# Patient Record
Sex: Male | Born: 1955 | Race: Black or African American | Hispanic: No | Marital: Married
Health system: Southern US, Community
[De-identification: ages and names within clinical notes are randomized; demographics above are authoritative.]

## PROBLEM LIST (undated history)

## (undated) DIAGNOSIS — E119 Type 2 diabetes mellitus without complications: Secondary | ICD-10-CM

---

## 2014-08-22 ENCOUNTER — Emergency Department (HOSPITAL_COMMUNITY): Payer: Managed Care, Other (non HMO)

## 2014-08-22 ENCOUNTER — Inpatient Hospital Stay (HOSPITAL_COMMUNITY): Payer: Managed Care, Other (non HMO)

## 2014-08-22 ENCOUNTER — Encounter (HOSPITAL_COMMUNITY): Payer: Self-pay | Admitting: *Deleted

## 2014-08-22 ENCOUNTER — Inpatient Hospital Stay (HOSPITAL_COMMUNITY)
Admission: EM | Admit: 2014-08-22 | Discharge: 2014-08-25 | DRG: 871 | Disposition: E | Payer: Managed Care, Other (non HMO) | Attending: Internal Medicine | Admitting: Internal Medicine

## 2014-08-22 DIAGNOSIS — J9600 Acute respiratory failure, unspecified whether with hypoxia or hypercapnia: Secondary | ICD-10-CM | POA: Diagnosis present

## 2014-08-22 DIAGNOSIS — R4182 Altered mental status, unspecified: Secondary | ICD-10-CM | POA: Diagnosis present

## 2014-08-22 DIAGNOSIS — R6521 Severe sepsis with septic shock: Secondary | ICD-10-CM | POA: Diagnosis present

## 2014-08-22 DIAGNOSIS — R400 Somnolence: Secondary | ICD-10-CM

## 2014-08-22 DIAGNOSIS — E1165 Type 2 diabetes mellitus with hyperglycemia: Secondary | ICD-10-CM | POA: Diagnosis present

## 2014-08-22 DIAGNOSIS — R739 Hyperglycemia, unspecified: Secondary | ICD-10-CM

## 2014-08-22 DIAGNOSIS — N179 Acute kidney failure, unspecified: Secondary | ICD-10-CM | POA: Diagnosis present

## 2014-08-22 DIAGNOSIS — B9620 Unspecified Escherichia coli [E. coli] as the cause of diseases classified elsewhere: Secondary | ICD-10-CM | POA: Diagnosis present

## 2014-08-22 DIAGNOSIS — A419 Sepsis, unspecified organism: Secondary | ICD-10-CM | POA: Diagnosis present

## 2014-08-22 DIAGNOSIS — E872 Acidosis, unspecified: Secondary | ICD-10-CM

## 2014-08-22 DIAGNOSIS — I959 Hypotension, unspecified: Secondary | ICD-10-CM

## 2014-08-22 DIAGNOSIS — A4151 Sepsis due to Escherichia coli [E. coli]: Principal | ICD-10-CM | POA: Diagnosis present

## 2014-08-22 DIAGNOSIS — R6520 Severe sepsis without septic shock: Secondary | ICD-10-CM

## 2014-08-22 DIAGNOSIS — N390 Urinary tract infection, site not specified: Secondary | ICD-10-CM | POA: Diagnosis present

## 2014-08-22 DIAGNOSIS — E1310 Other specified diabetes mellitus with ketoacidosis without coma: Secondary | ICD-10-CM | POA: Diagnosis present

## 2014-08-22 DIAGNOSIS — I469 Cardiac arrest, cause unspecified: Secondary | ICD-10-CM

## 2014-08-22 DIAGNOSIS — R0603 Acute respiratory distress: Secondary | ICD-10-CM

## 2014-08-22 DIAGNOSIS — Z7982 Long term (current) use of aspirin: Secondary | ICD-10-CM | POA: Diagnosis not present

## 2014-08-22 HISTORY — DX: Type 2 diabetes mellitus without complications: E11.9

## 2014-08-22 LAB — CBC WITH DIFFERENTIAL/PLATELET
Basophils Absolute: 0 10*3/uL (ref 0.0–0.1)
Basophils Absolute: 0 10*3/uL (ref 0.0–0.1)
Basophils Relative: 0 % (ref 0–1)
Basophils Relative: 0 % (ref 0–1)
EOS ABS: 0 10*3/uL (ref 0.0–0.7)
Eosinophils Absolute: 0 10*3/uL (ref 0.0–0.7)
Eosinophils Relative: 0 % (ref 0–5)
Eosinophils Relative: 0 % (ref 0–5)
HCT: 32.7 % — ABNORMAL LOW (ref 39.0–52.0)
HCT: 30.7 % — ABNORMAL LOW (ref 39.0–52.0)
Hemoglobin: 11.5 g/dL — ABNORMAL LOW (ref 13.0–17.0)
Hemoglobin: 10.8 g/dL — ABNORMAL LOW (ref 13.0–17.0)
LYMPHS ABS: 1.7 10*3/uL (ref 0.7–4.0)
LYMPHS PCT: 6 % — AB (ref 12–46)
Lymphocytes Relative: 10 % — ABNORMAL LOW (ref 12–46)
Lymphs Abs: 0.6 10*3/uL — ABNORMAL LOW (ref 0.7–4.0)
MCH: 29 pg (ref 26.0–34.0)
MCH: 28.3 pg (ref 26.0–34.0)
MCHC: 35.2 g/dL (ref 30.0–36.0)
MCHC: 35.2 g/dL (ref 30.0–36.0)
MCV: 82.4 fL (ref 78.0–100.0)
MCV: 80.6 fL (ref 78.0–100.0)
MONOS PCT: 5 % (ref 3–12)
MONOS PCT: 1 % — AB (ref 3–12)
Monocytes Absolute: 0.5 10*3/uL (ref 0.1–1.0)
Monocytes Absolute: 0.2 10*3/uL (ref 0.1–1.0)
NEUTROS ABS: 14.8 10*3/uL — AB (ref 1.7–7.7)
NEUTROS PCT: 89 % — AB (ref 43–77)
NEUTROS PCT: 89 % — AB (ref 43–77)
Neutro Abs: 8.5 10*3/uL — ABNORMAL HIGH (ref 1.7–7.7)
PLATELETS: 86 10*3/uL — AB (ref 150–400)
Platelets: 78 10*3/uL — ABNORMAL LOW (ref 150–400)
RBC: 3.97 MIL/uL — ABNORMAL LOW (ref 4.22–5.81)
RBC: 3.81 MIL/uL — AB (ref 4.22–5.81)
RDW: 12.9 % (ref 11.5–15.5)
RDW: 13 % (ref 11.5–15.5)
WBC Morphology: INCREASED
WBC: 9.6 10*3/uL (ref 4.0–10.5)
WBC: 16.7 10*3/uL — ABNORMAL HIGH (ref 4.0–10.5)

## 2014-08-22 LAB — URINALYSIS, ROUTINE W REFLEX MICROSCOPIC
Glucose, UA: 100 mg/dL — AB
Ketones, ur: 15 mg/dL — AB
Nitrite: NEGATIVE
PH: 5 (ref 5.0–8.0)
Protein, ur: 100 mg/dL — AB
Specific Gravity, Urine: 1.016 (ref 1.005–1.030)
Urobilinogen, UA: 1 mg/dL (ref 0.0–1.0)

## 2014-08-22 LAB — I-STAT VENOUS BLOOD GAS, ED
ACID-BASE DEFICIT: 13 mmol/L — AB (ref 0.0–2.0)
Bicarbonate: 13.4 mEq/L — ABNORMAL LOW (ref 20.0–24.0)
O2 SAT: 42 %
PCO2 VEN: 31.3 mmHg — AB (ref 45.0–50.0)
PO2 VEN: 27 mmHg — AB (ref 30.0–45.0)
TCO2: 14 mmol/L (ref 0–100)
pH, Ven: 7.239 — ABNORMAL LOW (ref 7.250–7.300)

## 2014-08-22 LAB — COMPREHENSIVE METABOLIC PANEL
ALBUMIN: 2.1 g/dL — AB (ref 3.5–5.0)
ALK PHOS: 107 U/L (ref 38–126)
ALT: 35 U/L (ref 17–63)
ALT: 86 U/L — ABNORMAL HIGH (ref 17–63)
ANION GAP: 18 — AB (ref 5–15)
AST: 81 U/L — AB (ref 15–41)
AST: 178 U/L — ABNORMAL HIGH (ref 15–41)
Albumin: 2.3 g/dL — ABNORMAL LOW (ref 3.5–5.0)
Alkaline Phosphatase: 92 U/L (ref 38–126)
Anion gap: 18 — ABNORMAL HIGH (ref 5–15)
BILIRUBIN TOTAL: 2.8 mg/dL — AB (ref 0.3–1.2)
BUN: 44 mg/dL — ABNORMAL HIGH (ref 6–20)
BUN: 47 mg/dL — ABNORMAL HIGH (ref 6–20)
CALCIUM: 6.6 mg/dL — AB (ref 8.9–10.3)
CO2: 14 mmol/L — ABNORMAL LOW (ref 22–32)
CO2: 14 mmol/L — ABNORMAL LOW (ref 22–32)
Calcium: 7.6 mg/dL — ABNORMAL LOW (ref 8.9–10.3)
Chloride: 98 mmol/L — ABNORMAL LOW (ref 101–111)
Chloride: 102 mmol/L (ref 101–111)
Creatinine, Ser: 4.39 mg/dL — ABNORMAL HIGH (ref 0.61–1.24)
Creatinine, Ser: 4.38 mg/dL — ABNORMAL HIGH (ref 0.61–1.24)
GFR calc Af Amer: 16 mL/min — ABNORMAL LOW (ref >60)
GFR calc non Af Amer: 13 mL/min — ABNORMAL LOW (ref >60)
GFR, EST AFRICAN AMERICAN: 16 mL/min — AB (ref >60)
GFR, EST NON AFRICAN AMERICAN: 13 mL/min — AB (ref >60)
GLUCOSE: 448 mg/dL — AB (ref 65–99)
GLUCOSE: 304 mg/dL — AB (ref 65–99)
POTASSIUM: 4.1 mmol/L (ref 3.5–5.1)
Potassium: 3.6 mmol/L (ref 3.5–5.1)
SODIUM: 130 mmol/L — AB (ref 135–145)
SODIUM: 134 mmol/L — AB (ref 135–145)
TOTAL PROTEIN: 4.7 g/dL — AB (ref 6.5–8.1)
Total Bilirubin: 2.4 mg/dL — ABNORMAL HIGH (ref 0.3–1.2)
Total Protein: 4.5 g/dL — ABNORMAL LOW (ref 6.5–8.1)

## 2014-08-22 LAB — CBG MONITORING, ED
Glucose-Capillary: 369 mg/dL — ABNORMAL HIGH (ref 65–99)
Glucose-Capillary: 313 mg/dL — ABNORMAL HIGH (ref 65–99)

## 2014-08-22 LAB — I-STAT CHEM 8, ED
BUN: 42 mg/dL — ABNORMAL HIGH (ref 6–20)
BUN: 43 mg/dL — AB (ref 6–20)
CALCIUM ION: 0.97 mmol/L — AB (ref 1.12–1.23)
CREATININE: 4 mg/dL — AB (ref 0.61–1.24)
Calcium, Ion: 0.83 mmol/L — ABNORMAL LOW (ref 1.12–1.23)
Chloride: 98 mmol/L — ABNORMAL LOW (ref 101–111)
Chloride: 100 mmol/L — ABNORMAL LOW (ref 101–111)
Creatinine, Ser: 4.3 mg/dL — ABNORMAL HIGH (ref 0.61–1.24)
Glucose, Bld: 479 mg/dL — ABNORMAL HIGH (ref 65–99)
Glucose, Bld: 288 mg/dL — ABNORMAL HIGH (ref 65–99)
HEMATOCRIT: 37 % — AB (ref 39.0–52.0)
HEMATOCRIT: 32 % — AB (ref 39.0–52.0)
Hemoglobin: 12.6 g/dL — ABNORMAL LOW (ref 13.0–17.0)
Hemoglobin: 10.9 g/dL — ABNORMAL LOW (ref 13.0–17.0)
POTASSIUM: 3.5 mmol/L (ref 3.5–5.1)
Potassium: 4.2 mmol/L (ref 3.5–5.1)
Sodium: 127 mmol/L — ABNORMAL LOW (ref 135–145)
Sodium: 135 mmol/L (ref 135–145)
TCO2: 12 mmol/L (ref 0–100)
TCO2: 14 mmol/L (ref 0–100)

## 2014-08-22 LAB — POCT I-STAT 3, ART BLOOD GAS (G3+)
ACID-BASE DEFICIT: 17 mmol/L — AB (ref 0.0–2.0)
Bicarbonate: 13.4 mEq/L — ABNORMAL LOW (ref 20.0–24.0)
O2 SAT: 96 %
PCO2 ART: 51.6 mmHg — AB (ref 35.0–45.0)
TCO2: 15 mmol/L (ref 0–100)
pH, Arterial: 7.023 — CL (ref 7.350–7.450)
pO2, Arterial: 122 mmHg — ABNORMAL HIGH (ref 80.0–100.0)

## 2014-08-22 LAB — URINE MICROSCOPIC-ADD ON

## 2014-08-22 LAB — I-STAT TROPONIN, ED
TROPONIN I, POC: 0.64 ng/mL — AB (ref 0.00–0.08)
Troponin i, poc: 1.67 ng/mL (ref 0.00–0.08)

## 2014-08-22 LAB — I-STAT CG4 LACTIC ACID, ED
LACTIC ACID, VENOUS: 11.85 mmol/L — AB (ref 0.5–2.0)
Lactic Acid, Venous: 10.34 mmol/L (ref 0.5–2.0)

## 2014-08-22 LAB — MAGNESIUM: Magnesium: 1.4 mg/dL — ABNORMAL LOW (ref 1.7–2.4)

## 2014-08-22 LAB — PROTIME-INR
INR: 2.07 — AB (ref 0.00–1.49)
Prothrombin Time: 23.2 seconds — ABNORMAL HIGH (ref 11.6–15.2)

## 2014-08-22 LAB — GRAM STAIN

## 2014-08-22 LAB — PHOSPHORUS: PHOSPHORUS: 7 mg/dL — AB (ref 2.5–4.6)

## 2014-08-22 LAB — APTT: APTT: 72 s — AB (ref 24–37)

## 2014-08-22 LAB — CORTISOL: CORTISOL PLASMA: 38.3 ug/dL

## 2014-08-22 LAB — GLUCOSE, CAPILLARY: Glucose-Capillary: 247 mg/dL — ABNORMAL HIGH (ref 65–99)

## 2014-08-22 LAB — LACTIC ACID, PLASMA: Lactic Acid, Venous: 9.6 mmol/L (ref 0.5–2.0)

## 2014-08-22 MED ORDER — HEPARIN SODIUM (PORCINE) 5000 UNIT/ML IJ SOLN: 5000.0000 [IU] | Freq: Three times a day (TID) | INTRAMUSCULAR | Status: DC

## 2014-08-22 MED ORDER — LIDOCAINE HCL (CARDIAC) 20 MG/ML IV SOLN
INTRAVENOUS | Status: AC
  Filled 2014-08-22: qty 5

## 2014-08-22 MED ORDER — PIPERACILLIN-TAZOBACTAM 3.375 G IVPB 30 MIN
3.3750 g | Freq: Three times a day (TID) | INTRAVENOUS | Status: DC
  Administered 2014-08-22: 3.375 g via INTRAVENOUS
  Filled 2014-08-22 (×4): qty 50

## 2014-08-22 MED ORDER — SODIUM CHLORIDE 0.9 % IV BOLUS (SEPSIS)
30.0000 mL/kg | Freq: Once | INTRAVENOUS | Status: AC
  Administered 2014-08-22: 2721 mL via INTRAVENOUS

## 2014-08-22 MED ORDER — VANCOMYCIN HCL IN DEXTROSE 1-5 GM/200ML-% IV SOLN: 1000.0000 mg | INTRAVENOUS | Status: DC

## 2014-08-22 MED ORDER — ASPIRIN 81 MG PO CHEW: 324.0000 mg | CHEWABLE_TABLET | ORAL | Status: DC

## 2014-08-22 MED ORDER — SODIUM CHLORIDE 0.9 % IV SOLN: 250.0000 mL | INTRAVENOUS | Status: DC | PRN

## 2014-08-22 MED ORDER — SODIUM CHLORIDE 0.9 % IV SOLN
INTRAVENOUS | Status: DC
  Administered 2014-08-22: 5.1 [IU]/h via INTRAVENOUS
  Administered 2014-08-22: 3.1 [IU]/h via INTRAVENOUS
  Filled 2014-08-22: qty 2.5

## 2014-08-22 MED ORDER — EPINEPHRINE HCL 1 MG/ML IJ SOLN
0.5000 ug/min | INTRAVENOUS | Status: DC
  Filled 2014-08-22 (×2): qty 4

## 2014-08-22 MED ORDER — VANCOMYCIN HCL IN DEXTROSE 1-5 GM/200ML-% IV SOLN
1000.0000 mg | Freq: Once | INTRAVENOUS | Status: AC
  Administered 2014-08-22: 1000 mg via INTRAVENOUS
  Filled 2014-08-22: qty 200

## 2014-08-22 MED ORDER — VASOPRESSIN 20 UNIT/ML IV SOLN
0.0300 [IU]/min | INTRAVENOUS | Status: DC
  Filled 2014-08-22: qty 2

## 2014-08-22 MED ORDER — PHENYLEPHRINE HCL 10 MG/ML IJ SOLN
20.0000 ug/min | INTRAVENOUS | Status: DC
  Filled 2014-08-22 (×2): qty 1

## 2014-08-22 MED ORDER — PANTOPRAZOLE SODIUM 40 MG IV SOLR: 40.0000 mg | Freq: Every day | INTRAVENOUS | Status: DC

## 2014-08-22 MED ORDER — SUCCINYLCHOLINE CHLORIDE 20 MG/ML IJ SOLN
INTRAMUSCULAR | Status: AC
  Filled 2014-08-22: qty 1

## 2014-08-22 MED ORDER — SODIUM CHLORIDE 0.9 % IV BOLUS (SEPSIS)
1000.0000 mL | Freq: Once | INTRAVENOUS | Status: AC
  Administered 2014-08-22: 1000 mL via INTRAVENOUS

## 2014-08-22 MED ORDER — SODIUM CHLORIDE 0.9 % IV SOLN: INTRAVENOUS | Status: DC

## 2014-08-22 MED ORDER — ASPIRIN 300 MG RE SUPP: 300.0000 mg | RECTAL | Status: DC

## 2014-08-22 MED ORDER — SODIUM CHLORIDE 0.9 % IV SOLN: INTRAVENOUS | Status: AC

## 2014-08-22 MED ORDER — NOREPINEPHRINE BITARTRATE 1 MG/ML IV SOLN
0.0000 ug/min | INTRAVENOUS | Status: DC
  Filled 2014-08-22: qty 4

## 2014-08-22 MED ORDER — DEXTROSE-NACL 5-0.45 % IV SOLN: INTRAVENOUS | Status: DC

## 2014-08-22 MED ORDER — POTASSIUM CHLORIDE 10 MEQ/50ML IV SOLN
INTRAVENOUS | Status: AC
  Filled 2014-08-22: qty 150

## 2014-08-22 MED ORDER — ETOMIDATE 2 MG/ML IV SOLN
INTRAVENOUS | Status: AC
  Filled 2014-08-22: qty 20

## 2014-08-22 MED ORDER — ROCURONIUM BROMIDE 50 MG/5ML IV SOLN
INTRAVENOUS | Status: AC
  Filled 2014-08-22: qty 2

## 2014-08-22 MED ORDER — INSULIN REGULAR HUMAN 100 UNIT/ML IJ SOLN
INTRAMUSCULAR | Status: DC
  Filled 2014-08-22: qty 2.5

## 2014-08-22 NOTE — Code Documentation (Signed)
Sod bicarb bristojet given

## 2014-08-22 NOTE — Code Documentation (Signed)
EPI DRIP INCREASED TO  IV MORGAN RN

## 2014-08-22 NOTE — Code Documentation (Signed)
No pulse 

## 2014-08-22 NOTE — Code Documentation (Signed)
  roc iv

## 2014-08-22 NOTE — Progress Notes (Signed)
Patient transported to 2M03 without any complications

## 2014-08-22 NOTE — Code Documentation (Signed)
Atropine 1 jet iv brenda.rn

## 2014-08-22 NOTE — Code Documentation (Signed)
Pt stopped breathing.  cpr initiated.  Heart rhy no pulse.

## 2014-08-22 NOTE — Code Documentation (Signed)
epine 1 jet brenda rn

## 2014-08-22 NOTE — Progress Notes (Signed)
Chaplain responded to a code blue. Pt was getting CPR. Chaplain  Called family in [place]. Pt transitioned while family was in route. Chaplain came to support family when they arrived.    09-16-2014 2200  Clinical Encounter Type  Visited With Family  Visit Type Spiritual support  Referral From Care management  Spiritual Encounters  Spiritual Needs Prayer;Emotional;Grief support  Stress Factors  Family Stress Factors Loss

## 2014-08-22 NOTE — ED Notes (Signed)
Stoney Karczewski, wife [phone], Dahlia Client [phone]

## 2014-08-22 NOTE — Code Documentation (Signed)
PULSE GETTING THREADY

## 2014-08-22 NOTE — Code Documentation (Signed)
cpr continues. Being bagged

## 2014-08-22 NOTE — Code Documentation (Signed)
CPR RESUMED NO PULSE.

## 2014-08-22 NOTE — H&P (Addendum)
PULMONARY / CRITICAL CARE MEDICINE   Name: Anthony Ferrell MRN: [id number] DOB: [birth year]    ADMISSION DATE:  08/18/2014 CONSULTATION DATE:  08/14/2014  REFERRING MD :  edp  CHIEF COMPLAINT: lactic acidosis, change in ms  INITIAL PRESENTATION:  59 yr old unclear pmh except DM found unresponsive, hyperglycemia, urosepsis (uti, septic shock), ARF  STUDIES:  7/29 ct head>>>left temp swelling, unclear foreign body, neg brain ct  SIGNIFICANT EVENTS: 7/29 - admitted confused, hyperglycemic, LA elevated   HISTORY OF PRESENT ILLNESS:  59 yr old no history available, Pt picked up by [facility] from hotel parking lot. pt was walking around parking lot in soiled underwear, stumbling and not making any sense. EMS stated cbg in 500's. Given 1 L en-roue. tachycardic. FOund with ARF presumed crt 4, hyponatremic, LA 11.85 and acidotic. Presumed dka component.  PAST MEDICAL HISTORY :   has a past medical history of Diabetes mellitus without complication.  has no past surgical history on file. Prior to Admission medications   Medication Sig Start Date End Date Taking? Authorizing Provider  aspirin 81 MG tablet Take 81 mg by mouth daily.   Yes Historical Provider, MD  Liraglutide (VICTOZA) 18 MG/3ML SOPN Inject into the skin. Patient is unable to tell me the dose he is taking   Yes Historical Provider, MD  nitroGLYCERIN (NITROSTAT) 0.4 MG SL tablet Place 0.4 mg under the tongue every 5 (five) minutes as needed for chest pain.   Yes Historical Provider, MD  pioglitazone-glimepiride (DUETACT) 30-2 MG per tablet Take 1 tablet by mouth daily with breakfast.   Yes Historical Provider, MD  pravastatin (PRAVACHOL) 20 MG tablet Take 20 mg by mouth daily.   Yes Historical Provider, MD  ramipril (ALTACE) 5 MG capsule Take 5 mg by mouth daily.   Yes Historical Provider, MD  rosuvastatin (CRESTOR) 20 MG tablet Take 20 mg by mouth daily.   Yes Historical Provider, MD   No Known Allergies  FAMILY HISTORY:  has no  family status information on file.  unknown, unable SOCIAL HISTORY: unable, unknown, not able to give history    REVIEW OF SYSTEMS: change in MS, unknwon , unable  SUBJECTIVE: sleeping, mild increase RR  VITAL SIGNS: Temp:  [102.1 F (38.9 C)] 102.1 F (38.9 C) (07/29 1550) Pulse Rate:  [73] 73 (07/29 1545) Resp:  [28-37] 37 (07/29 1600) BP: (67-90)/(42-57) 72/53 mmHg (07/29 1615) SpO2:  [37 %] 37 % (07/29 1545) Weight:  [77.111 kg (170 lb)] 77.111 kg (170 lb) (07/29 1656) HEMODYNAMICS:   VENTILATOR SETTINGS:   INTAKE / OUTPUT: No intake or output data in the 24 hours ending 08/11/2014 1815  PHYSICAL EXAMINATION: General:  Awakens,. confused Neuro:  Moves all ext equally 2 mm perrl HEENT:  Defect in left neck, prior surgery?, nodes or vessels palpable Cardiovascular:  s1 s2 RRR Lungs:  CTA Abdomen:  Soft, BS wnl. No r/g Musculoskeletal: no edema Skin:  Diffuse vesicular rash, not red  LABS:  CBC  Recent Labs Lab 07/25/2014 1535 08/04/2014 1549  WBC 9.6  --   HGB 11.5* 12.6*  HCT 32.7* 37.0*  PLT 86*  --    Coag's No results for input(s): APTT, INR in the last 168 hours. BMET  Recent Labs Lab 08/03/2014 1535 07/25/2014 1549  NA 130* 127*  K 4.1 4.2  CL 98* 98*  CO2 14*  --   BUN 44* 42*  CREATININE 4.39* 4.00*  GLUCOSE 448* 479*   Electrolytes  Recent Labs Lab 07/28/2014 1535  CALCIUM 7.6*   Sepsis Markers  Recent Labs Lab 08/20/2014 1551  LATICACIDVEN 11.85*   ABG No results for input(s): PHART, PCO2ART, PO2ART in the last 168 hours. Liver Enzymes  Recent Labs Lab 08/21/2014 1535  AST 81*  ALT 35  ALKPHOS 92  BILITOT 2.4*  ALBUMIN 2.3*   Cardiac Enzymes No results for input(s): TROPONINI, PROBNP in the last 168 hours. Glucose  Recent Labs Lab 08/02/2014 1708  GLUCAP 369*    Imaging Ct Head Wo Contrast  08/04/2014   CLINICAL DATA:  Altered mental status.  Disoriented.  EXAM: CT HEAD WITHOUT CONTRAST  TECHNIQUE: Contiguous axial images  were obtained from the base of the skull through the vertex without intravenous contrast.  COMPARISON:  None.  FINDINGS: No acute intracranial abnormality. Specifically, no hemorrhage, hydrocephalus, mass lesion, acute infarction, or significant intracranial injury. No acute calvarial abnormality.  There is soft tissue swelling noted in the left temporal region and extending in the left base superficial soft tissues. There is metallic foreign body noted within the soft tissues in the left lateral face inferior to the left external auditory canal. The external auditory canal is not visualized, presumably occluded by the soft tissue swelling which extends into the left ear. Recommend clinical correlation.  IMPRESSION: Soft tissue swelling in the left side of the face and temporal region and in the region of the left ear. Metallic foreign body noted within the soft tissues in the area swelling. This is of unknown etiology. Recommend clinical correlation.  No intracranial abnormality.   Electronically Signed   By: Rolm Baptise M.D.   On: 07/25/2014 17:55   Dg Chest Port 1 View  08/18/2014   CLINICAL DATA:  Altered mental status.  Fever.  EXAM: PORTABLE CHEST - 1 VIEW  COMPARISON:  None.  FINDINGS: There is slight haziness at both lung bases but the patient has taken a shallow inspiration. Heart size and pulmonary vascularity are normal. No acute osseous abnormality.  IMPRESSION: Haziness at the bases is most likely due to a shallow inspiration but I cannot exclude subtle infiltrates.   Electronically Signed   By: Lorriane Shire M.D.   On: 08/09/2014 16:21     ASSESSMENT / PLAN:  PULMONARY  A: Likely slight uncompensated met acidosis (dka), acute respiratory failure P:   No evidence pNA Treat dka  CARDIOVASCULAR A: Presumed occult Septic shock, hypovolemia P:  LA repeat needed after 30 cc/kg bolus Hope will clear rapid with volume as appears so dry May need pressors, he is improving BP and perfusion  now Tele May need cvp line and cvp May need echo assessment Assess cortisol  RENAL A:  ARF presumed, r./o CRI, hypovolemia P:   volume 30 cc/kg received Bolus further bmet q8h  GASTROINTESTINAL A:  LFT wnl P:   Repeat LFT in am  Npo ppi  HEMATOLOGIC A:  DVT prevention P:  Sub q planned coags needed  INFECTIOUS A:  Occult septic shock, source urine likely Sepsis from e coli  P:   BCx2  7/29>>> UC 7/29>>> ZOX:WRUEA 7/29>>> vanc 7/29>>>  Needs renal US with ARF, sepsis, dirty urine r/o hydro  ENDOCRINE A:  DKA  P:   Insulin drip bmet protocol  NEUROLOGIC A:  Acute encephalopathy    Metabolic encephalopathy P:   RASS goal: 0 Ct neg Limit sedation  FAMILY  - Updates:   - Inter-disciplinary family meet or Palliative Care meeting due by: 8/5  Ccm time 40 min   Quillian Quince  J. Titus Mould, MD, [place] Pgr: [facility]  Pulmonary and [place] Pager: [phone]  08/17/2014, 6:15 PM

## 2014-08-22 NOTE — ED Notes (Signed)
Patient transported to CT 

## 2014-08-22 NOTE — Code Documentation (Signed)
THE PT HAS A PULSE.

## 2014-08-22 NOTE — Progress Notes (Addendum)
Pt time of death is 2105. No heart or breath sound present. By Caralee Ates, RN, and Ladora Daniel, RN. Asystoli on monitor. Pupils fixed and dilated. Family not present at time of death. Called and updated by Dr. Donnamae Jude. Family arrived at 2230, Candelaria Stagers present.

## 2014-08-22 NOTE — Code Documentation (Signed)
CPR CONTINUES.

## 2014-08-22 NOTE — Code Documentation (Signed)
PULSE BACK NO PERFUSION  BY [place]

## 2014-08-22 NOTE — Progress Notes (Signed)
Patient WILL be a medical examiner case per on call ME.

## 2014-08-22 NOTE — Code Documentation (Signed)
CODE BLUE NOTE  Patient Name: Anthony Ferrell   MRN: [id number]   Date of Birth/ Sex: 04/28/1955 , male      Admission Date: 09/10/14  Attending Provider: Nelda Bucks, MD  Primary Diagnosis: Altered mental status [R41.82] Sepsis [A41.9]    Indication: Pt brought by [facility] from hotel parking lot where he was found to be in soiled underwear, stumbling and not making any sense. EMS stated cbg in 500's. Given 1 L en-roue. tachycardic. Found with ARF presumed crt 4, hyponatremic, LA 11.85 and acidotic.  Went into [facility]. Code blue was subsequently called. At the time of arrival on scene, ACLS protocol was underway.   Technical Description:  - CPR performance duration:  35  minutes   - Was defibrillation or cardioversion used? Yes   - Was external pacer placed? No  - Was patient intubated pre/post CPR? Yes    Medications Administered: Y = Yes; Blank = No Amiodarone    Atropine    Calcium  yes  Epinephrine  yes  Lidocaine    Magnesium  yes  Norepinephrine    Phenylephrine    Sodium bicarbonate  yes  Vasopressin  yes    Post CPR evaluation:  - Final Status - Was patient successfully resuscitated ? No   Miscellaneous Information:  - Time of death:  21:05  PM  - Primary team notified?  Yes  - Family Notified? No. Family from [place]. [place]. Family contacted when pt was in code. They were on there way here. It was decided not to call them while they are on road out of concern for their safety.       Almon Hercules, MD   10-Sep-2014, 9:59 PM

## 2014-08-22 NOTE — Code Documentation (Signed)
intubaTED 25 AT THE TEETH.PER DR Hyacinth Meeker

## 2014-08-22 NOTE — Code Documentation (Signed)
CHEST COMPRESSIONS STOPPED

## 2014-08-22 NOTE — Code Documentation (Signed)
PULSE PRESENT 

## 2014-08-22 NOTE — Code Documentation (Signed)
preparinbg to intubate

## 2014-08-22 NOTE — Code Documentation (Signed)
RT GROIN CENTRAL LINE PLACED ED RES

## 2014-08-22 NOTE — Code Documentation (Signed)
Still attempting to intubate.  cpr continues

## 2014-08-22 NOTE — Code Documentation (Signed)
BLOOD DRAWN.

## 2014-08-22 NOTE — Code Documentation (Signed)
EPI DRIP  STARTED MORGAN IV LT A-C  0.5

## 2014-08-22 NOTE — Code Documentation (Signed)
NO PULSE AGAIN CPR STARTED

## 2014-08-22 NOTE — Code Documentation (Signed)
CPR CONTINUED  ?

## 2014-08-22 NOTE — ED Notes (Signed)
PORT CHEST FOR TUBE PLACEMENT

## 2014-08-22 NOTE — Code Documentation (Signed)
EPIN JET IV MORGAN RN 

## 2014-08-22 NOTE — Code Documentation (Signed)
cpr continues

## 2014-08-22 NOTE — Code Documentation (Signed)
CHEST COMPRESSION STOPPED

## 2014-08-22 NOTE — Code Documentation (Signed)
CODE RESTARTED CPR

## 2014-08-22 NOTE — Code Documentation (Signed)
CHEST COMPRESSIONS STOPPED 

## 2014-08-22 NOTE — Code Documentation (Signed)
EPINE JET IV BRENDA RN

## 2014-08-22 NOTE — ED Notes (Signed)
Pt picked up by [facility] from hotel parking lot.  Staff stated pt was walking around parking lot in soiled underwear, stumbling and not making any sense.  EMS stated cbg in 500's.  Given 1 L en-roue.  HR in 140's.  Pt responsive to voice.  Able to give name of wife.

## 2014-08-22 NOTE — Code Documentation (Signed)
EDP AND CRITICAL CARE AT THE BEDSIDE

## 2014-08-22 NOTE — Code Documentation (Signed)
ATROPINE JET IV MORGAN RN

## 2014-08-22 NOTE — Code Documentation (Signed)
EPIN JET   IV BRENDA RN

## 2014-08-22 NOTE — ED Provider Notes (Addendum)
The pt is a 59 y/o male brought in by EMS with AMS, patient is unable to answer any questions as he has altered mental status. According to the first responders the patient was found walking around the parking lot, confused, stumbling, he was brought back to his room, paramedics were called, found him to be hypotensive and hyperglycemic. A patient's belongings were in the room, he does have an insulin pen as well as blood glucose testing strips. The patient is able to say his name, he is able to answer occasional questions with one-word answers, he is very tachycardic, severely hypotensive, has no peripheral edema, soft abdomen without guarding, he is to keep neck. His mucous membranes are severely dry. His mental status is very abnormal, the patient will need further workup with regards to metabolic infectious or traumatic etiologies. Of note the patient does have abnormal shape to the left side of his head and face suggesting prior surgery. We'll obtain CT scan of the brain, labs, potential DKA or other acidosis, check renal function, fluid bolus for fluid resuscitation. I personally placed an IV at the bedside due to inadequate IV access. The patient will need a central line if he does not respond to fluid boluses. The patient is critically ill.  Critical interventions   #1 multiple fluid boluses  #2 and type paramedics #3 broad-spectrum antibiotics to cover sepsis and septic shock #4 diabetic ketoacidosis insulin drip   the patient is critically ill, we'll discuss his care with the intensivist, needs admission to high level of care.  ED Sepsis - Repeat Assessment   Performed at:    August 22, 2014, 6:47 PM   Last Vitals:    Blood pressure 72/53, pulse 73, temperature 102.1 F (38.9 C), temperature source Rectal, resp. rate 37, height 5\' 9"  (1.753 m), weight 170 lb (77.111 kg), SpO2 37 %.  Heart:      Persistent tachycardia, no murmurs  Lungs:     Decreased lung sounds, mild  tachypnea  Capillary Refill:   Decreased  Peripheral Pulse (include location): Week at the radial arteries, strong femoral pulses   Skin (include color):   Pale, dry  The patient had continued decline, mean arterial pressure stayed between 55 and 60 however as the patient was being prepared to be transferred to the ICU he lost pulses, he went unresponsive and went into pulseless electrical activity. The patient was resuscitated with CPR, multiple medications were given including epinephrine, atropine, bicarbonate, he responded well to epinephrine however the patient required intubation and central line placement for pressors, epinephrine drip was started. Critical care aware and has accepted the patient to the ICU. The patient stabilized on an epinephrine drip and was able to be transferred to the intensive care unit under the care of Dr. Esaw Grandchild Performed by: Eber Hong D  Required items: required blood products, implants, devices, and special equipment available Patient identity confirmed: provided demographic data and hospital-assigned identification number Time out: Immediately prior to procedure a "time out" was called to verify the correct patient, procedure, equipment, support staff and site/side marked as required.  Initial intubation attempt by resident was unsuccessful, difficulty visualizing cords, I successfully attempted intubation 1 attempt.   Indications: cardiac arrest  Intubation method: Direct Laryngoscopy   Preoxygenation: BVM  Sedatives:  none Paralytic: 50mg  Rocuronium   Tube Size: 7.5 cuffed  Post-procedure assessment: chest rise and ETCO2 monitor Breath sounds: equal and absent over the epigastrium Tube secured with: ETT holder Chest x-ray interpreted by  radiologist and me.  Chest x-ray findings: endotracheal tube in appropriate position  Patient tolerated the procedure well with no immediate complications.  Cardiopulmonary Resuscitation  (CPR) Procedure Note Directed/Performed by: Vida Roller I personally directed ancillary staff and/or performed CPR in an effort to regain return of spontaneous circulation and to maintain cardiac, neuro and systemic perfusion.    CRITICAL CARE Performed by: Vida Roller Total critical care time: 60 Critical care time was exclusive of separately billable procedures and treating other patients. Critical care was necessary to treat or prevent imminent or life-threatening deterioration. Critical care was time spent personally by me on the following activities: development of treatment plan with patient and/or surrogate as well as nursing, discussions with consultants, evaluation of patient's response to treatment, examination of patient, obtaining history from patient or surrogate, ordering and performing treatments and interventions, ordering and review of laboratory studies, ordering and review of radiographic studies, pulse oximetry and re-evaluation of patient's condition.    EKG Interpretation  Date/Time:  Friday August 23, 2014 19:39:45 EDT Ventricular Rate:  164 PR Interval:  130 QRS Duration: 88 QT Interval:  290 QTC Calculation: 479 R Axis:   46 Text Interpretation:  Supraventricular tachycardia Borderline low voltage, extremity leads Abnormal R-wave progression, early transition Repolarization abnormality, prob rate related Baseline wander in lead(s) V3 V4 V5 V6 ED PHYSICIAN INTERPRETATION AVAILABLE IN [facility] Confirmed by TEST, Record ([id number]) on 08/12/2014 2:10:24 PM        Final diagnoses:  Altered mental status  Severe sepsis  Lactic acid acidosis  Hypotension, unspecified hypotension type  Hyperglycemia  Cardiac arrest  Respiratory distress   cardiac arrest Septic shock Acute renal failure Diabetic ketoacidosis   Eber Hong, MD 08/09/2014 1502  Eber Hong, MD 08/07/2014 2025

## 2014-08-22 NOTE — Code Documentation (Signed)
PULSE PRESENT

## 2014-08-22 NOTE — Progress Notes (Addendum)
ANTIBIOTIC CONSULT NOTE - INITIAL  Pharmacy Consult for Vancomycin and Zosyn Indication: rule out sepsis and UTI  Not on File  Patient Measurements: Height:  (175.3 cm) Weight: 170 lb (77.111 kg) IBW/kg (Calculated) : 70.7 Adjusted Body Weight:   Vital Signs: Temp: 102.1 F (38.9 C) (07/29 1550) Temp Source: Rectal (07/29 1550) BP: 90/47 mmHg (07/29 1529) Intake/Output from previous day:   Intake/Output from this shift:    Labs:  Recent Labs  Aug 30, 2014 1549  HGB 12.6*  CREATININE 4.00*   CrCl cannot be calculated (Unknown ideal weight.). No results for input(s): VANCOTROUGH, VANCOPEAK, VANCORANDOM, GENTTROUGH, GENTPEAK, GENTRANDOM, TOBRATROUGH, TOBRAPEAK, TOBRARND, AMIKACINPEAK, AMIKACINTROU, AMIKACIN in the last 72 hours.   Microbiology: No results found for this or any previous visit (from the past 720 hour(s)).  Medical History: Past Medical History  Diagnosis Date  . Diabetes mellitus without complication     Medications:   (Not in a hospital admission) Scheduled:   Infusions:  . piperacillin-tazobactam     Assessment: 59yo male presents with AMS and fever. Pharmacy is consulted to dose vancomycin and zosyn for UTI and suspected sepsis. Pt is febrile to 102.1, sCr 4, WBC 9.6, LA 11.85  Goal of Therapy:  Vancomycin trough level 15-20 mcg/ml  Plan:  Vancomycin 1g IV q48h Zosyn 3.375g IV q8h Measure antibiotic drug levels at steady state Follow up culture results, renal function, and clinical course  Arlean Hopping. Newman Pies, PharmD Clinical Pharmacist Pager [phone] 2014/08/30,4:05 PM

## 2014-08-22 NOTE — Code Documentation (Signed)
EPI  DRIP SWITCHED TO THE RT FEMORAL LINE

## 2014-08-22 NOTE — Code Documentation (Signed)
EPIN JET IV Multimedia programmer

## 2014-08-22 NOTE — ED Notes (Signed)
Dr Hyacinth Meeker given a copy of troponin 1.67 and lactic acid 10.34 results

## 2014-08-22 NOTE — ED Provider Notes (Signed)
CSN: [id number]     Arrival date & time 08/03/2014  1514 History   First MD Initiated Contact with Patient 07/27/2014 1519     Chief Complaint  Patient presents with  . Altered Mental Status     (Consider location/radiation/quality/duration/timing/severity/associated sxs/prior Treatment) The history is provided by the patient, the spouse and the EMS personnel (later in visit). The history is limited by the condition of the patient. No language interpreter was used.  Patient is a 59 year old male with PMH type II diabetes who presents today with confusion. 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 He was very disoriented on arrival and EMS notes fingerstick BG was in 500's. Pt unable to provide history initially on arrival. Patient notably given 1L IVF en route.   On arrival, pt unable to provide much of a history. Victoza pen notably with his belongings. Has glucometer as well.   Later in visit: patient's wife Cathi Roan calls, states pt has history of Type II diabetes. 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 States last time she spoke to him >24 hours ago he was at his normal baseline.   Past Medical History  Diagnosis Date  . Diabetes mellitus without complication    History reviewed. No pertinent past surgical history. No family history on file. History  Substance Use Topics  . Smoking status: Unknown If Ever Smoked  . Smokeless tobacco: Not on file  . Alcohol Use: Not on file    Review of Systems  Unable to perform ROS: Acuity of condition      Allergies  Review of patient's allergies indicates no known allergies.  Home Medications   Prior to Admission medications   Medication Sig Start Date End Date Taking? Authorizing Provider  aspirin 81 MG tablet Take 81 mg by mouth daily.   Yes Historical  Provider, MD  Liraglutide (VICTOZA) 18 MG/3ML SOPN Inject into the skin. Patient is unable to tell me the dose he is taking   Yes Historical Provider, MD  nitroGLYCERIN (NITROSTAT) 0.4 MG SL tablet Place 0.4 mg under the tongue every 5 (five) minutes as needed for chest pain.   Yes Historical Provider, MD  pioglitazone-glimepiride (DUETACT) 30-2 MG per tablet Take 1 tablet by mouth daily with breakfast.   Yes Historical Provider, MD  pravastatin (PRAVACHOL) 20 MG tablet Take 20 mg by mouth daily.   Yes Historical Provider, MD  ramipril (ALTACE) 5 MG capsule Take 5 mg by mouth daily.   Yes Historical Provider, MD  rosuvastatin (CRESTOR) 20 MG tablet Take 20 mg by mouth daily.   Yes Historical Provider, MD   BP 135/51 mmHg  Pulse 148  Temp(Src) 102.1 F (38.9 C) (Rectal)  Resp 16  Ht 5\' 9"  (1.753 m)  Wt 169 lb 15.6 oz (77.1 kg)  BMI 25.09 kg/m2  SpO2 98% Physical Exam  Constitutional: He appears distressed (notably taking rapid shallow breaths).  HENT:  Mouth/Throat: Mucous membranes are dry.  Eyes: Conjunctivae are normal. Pupils are equal, round, and reactive to light.  Neck: Normal range of motion. Neck supple.  Cardiovascular: S1 normal and S2 normal.  Tachycardia present.   Pulmonary/Chest: He is in respiratory distress. He has no wheezes. He has no rales.  Abdominal: Soft. He exhibits no distension.  Musculoskeletal: Normal range of  motion. He exhibits no tenderness.  Neurological: He is alert. He is disoriented. GCS eye subscore is 4. GCS verbal subscore is 3. GCS motor subscore is 5.  Skin: Skin is warm. No rash (On inspection, no rash present) noted. He is diaphoretic.  Nursing note and vitals reviewed.   ED Course  CRITICAL CARE Performed by: Madolyn Frieze Authorized by: Madolyn Frieze Critical care was necessary to treat or prevent imminent or life-threatening deterioration of the following conditions: sepsis, shock, metabolic crisis and circulatory failure. Critical care  was time spent personally by me on the following activities: development of treatment plan with patient or surrogate, discussions with consultants, examination of patient, obtaining history from patient or surrogate, ordering and performing treatments and interventions, ordering and review of laboratory studies, ordering and review of radiographic studies and re-evaluation of patient's condition.  CENTRAL LINE Date/Time: 08/24/2014 11:59 PM Performed by: Madolyn Frieze Authorized by: Madolyn Frieze Consent: The procedure was performed in an emergent situation. Required items: required blood products, implants, devices, and special equipment available Patient identity confirmed: arm band Indications: vascular access Patient sedated: no Preparation: skin prepped with 2% chlorhexidine Skin prep agent dried: skin prep agent completely dried prior to procedure Hand hygiene: hand hygiene performed prior to central venous catheter insertion Location details: right femoral Site selection rationale: emergent access needed Patient position: flat Catheter type: triple lumen Pre-procedure: landmarks identified Ultrasound guidance: no Number of attempts: 1 Successful placement: yes Post-procedure: line sutured and dressing applied Assessment: blood return through all ports and free fluid flow Patient tolerance: Patient tolerated the procedure well with no immediate complications  INTUBATION Date/Time: 08-29-2014 12:00 AM Performed by: Madolyn Frieze Authorized by: Madolyn Frieze Consent: The procedure was performed in an emergent situation. Patient identity confirmed: arm band Time out: Immediately prior to procedure a "time out" was called to verify the correct patient, procedure, equipment, support staff and site/side marked as required. Indications: respiratory failure and  airway protection Intubation method: direct Patient status: paralyzed (RSI) Preoxygenation: BVM Paralytic:  rocuronium Laryngoscope size: Mac 4 Tube size: 8.0 mm Tube type: cuffed Number of attempts: 1 Cricoid pressure: yes Cords visualized: yes Post-procedure assessment: chest rise and CO2 detector Breath sounds: equal ETT to teeth: 25 cm Tube secured with: ETT holder Chest x-ray interpreted by me. Chest x-ray findings: endotracheal tube in appropriate position Patient tolerance: Patient tolerated the procedure well with no immediate complications   (including critical care time) Labs Review Labs Reviewed  CBC WITH DIFFERENTIAL/PLATELET - Abnormal; Notable for the following:    RBC 3.97 (*)    Hemoglobin 11.5 (*)    HCT 32.7 (*)    Platelets 86 (*)    Neutrophils Relative % 89 (*)    Lymphocytes Relative 6 (*)    Neutro Abs 8.5 (*)    Lymphs Abs 0.6 (*)    All other components within normal limits  COMPREHENSIVE METABOLIC PANEL - Abnormal; Notable for the following:    Sodium 130 (*)    Chloride 98 (*)    CO2 14 (*)    Glucose, Bld 448 (*)    BUN 44 (*)    Creatinine, Ser 4.39 (*)    Calcium 7.6 (*)    Total Protein 4.7 (*)    Albumin 2.3 (*)    AST 81 (*)    Total Bilirubin 2.4 (*)    GFR calc non Af Amer 13 (*)    GFR calc Af Amer 16 (*)    Anion gap 18 (*)  All other components within normal limits  URINALYSIS, ROUTINE W REFLEX MICROSCOPIC (NOT AT [facility]) - Abnormal; Notable for the following:    Color, Urine AMBER (*)    APPearance TURBID (*)    Glucose, UA 100 (*)    Hgb urine dipstick LARGE (*)    Bilirubin Urine SMALL (*)    Ketones, ur 15 (*)    Protein, ur 100 (*)    Leukocytes, UA MODERATE (*)    All other components within normal limits  URINE MICROSCOPIC-ADD ON - Abnormal; Notable for the following:    Bacteria, UA FEW (*)    Casts GRANULAR CAST (*)    All other components within normal limits  LACTIC ACID, PLASMA - Abnormal; Notable for the following:    Lactic Acid, Venous 9.6 (*)    All other components within normal limits  CBC WITH  DIFFERENTIAL/PLATELET - Abnormal; Notable for the following:    WBC 16.7 (*)    RBC 3.81 (*)    Hemoglobin 10.8 (*)    HCT 30.7 (*)    Platelets 78 (*)    Neutrophils Relative % 89 (*)    Lymphocytes Relative 10 (*)    Monocytes Relative 1 (*)    Neutro Abs 14.8 (*)    All other components within normal limits  MAGNESIUM - Abnormal; Notable for the following:    Magnesium 1.4 (*)    All other components within normal limits  PHOSPHORUS - Abnormal; Notable for the following:    Phosphorus 7.0 (*)    All other components within normal limits  APTT - Abnormal; Notable for the following:    aPTT 72 (*)    All other components within normal limits  PROTIME-INR - Abnormal; Notable for the following:    Prothrombin Time 23.2 (*)    INR 2.07 (*)    All other components within normal limits  COMPREHENSIVE METABOLIC PANEL - Abnormal; Notable for the following:    Sodium 134 (*)    CO2 14 (*)    Glucose, Bld 304 (*)    BUN 47 (*)    Creatinine, Ser 4.38 (*)    Calcium 6.6 (*)    Total Protein 4.5 (*)    Albumin 2.1 (*)    AST 178 (*)    ALT 86 (*)    Total Bilirubin 2.8 (*)    GFR calc non Af Amer 13 (*)    GFR calc Af Amer 16 (*)    Anion gap 18 (*)    All other components within normal limits  GLUCOSE, CAPILLARY - Abnormal; Notable for the following:    Glucose-Capillary 247 (*)    All other components within normal limits  I-STAT CHEM 8, ED - Abnormal; Notable for the following:    Sodium 127 (*)    Chloride 98 (*)    BUN 42 (*)    Creatinine, Ser 4.00 (*)    Glucose, Bld 479 (*)    Calcium, Ion 0.83 (*)    Hemoglobin 12.6 (*)    HCT 37.0 (*)    All other components within normal limits  I-STAT CG4 LACTIC ACID, ED - Abnormal; Notable for the following:    Lactic Acid, Venous 11.85 (*)    All other components within normal limits  I-STAT VENOUS BLOOD GAS, ED - Abnormal; Notable for the following:    pH, Ven 7.239 (*)    pCO2, Ven 31.3 (*)    pO2, Ven 27.0 (*)  Bicarbonate 13.4 (*)    Acid-base deficit 13.0 (*)    All other components within normal limits  I-STAT TROPOININ, ED - Abnormal; Notable for the following:    Troponin i, poc 0.64 (*)    All other components within normal limits  CBG MONITORING, ED - Abnormal; Notable for the following:    Glucose-Capillary 369 (*)    All other components within normal limits  I-STAT CG4 LACTIC ACID, ED - Abnormal; Notable for the following:    Lactic Acid, Venous 10.34 (*)    All other components within normal limits  CBG MONITORING, ED - Abnormal; Notable for the following:    Glucose-Capillary 313 (*)    All other components within normal limits  I-STAT CHEM 8, ED - Abnormal; Notable for the following:    Chloride 100 (*)    BUN 43 (*)    Creatinine, Ser 4.30 (*)    Glucose, Bld 288 (*)    Calcium, Ion 0.97 (*)    Hemoglobin 10.9 (*)    HCT 32.0 (*)    All other components within normal limits  I-STAT TROPOININ, ED - Abnormal; Notable for the following:    Troponin i, poc 1.67 (*)    All other components within normal limits  POCT I-STAT 3, ART BLOOD GAS (G3+) - Abnormal; Notable for the following:    pH, Arterial 7.023 (*)    pCO2 arterial 51.6 (*)    pO2, Arterial 122.0 (*)    Bicarbonate 13.4 (*)    Acid-base deficit 17.0 (*)    All other components within normal limits  GRAM STAIN  CULTURE, BLOOD (ROUTINE X 2)  CULTURE, BLOOD (ROUTINE X 2)  URINE CULTURE  CORTISOL  CBC  LACTIC ACID, PLASMA  TROPONIN I  TROPONIN I  TROPONIN I  LIPASE, BLOOD  AMYLASE  PROCALCITONIN  CBC  BLOOD GAS, ARTERIAL  BASIC METABOLIC PANEL  BASIC METABOLIC PANEL  BASIC METABOLIC PANEL  COMPREHENSIVE METABOLIC PANEL  APTT  PROTIME-INR  CBC WITH DIFFERENTIAL/PLATELET  BLOOD GAS, ARTERIAL  BASIC METABOLIC PANEL  BASIC METABOLIC PANEL    Imaging Review Ct Head Wo Contrast  08/18/2014   CLINICAL DATA:  Altered mental status.  Disoriented.  EXAM: CT HEAD WITHOUT CONTRAST  TECHNIQUE: Contiguous  axial images were obtained from the base of the skull through the vertex without intravenous contrast.  COMPARISON:  None.  FINDINGS: No acute intracranial abnormality. Specifically, no hemorrhage, hydrocephalus, mass lesion, acute infarction, or significant intracranial injury. No acute calvarial abnormality.  There is soft tissue swelling noted in the left temporal region and extending in the left base superficial soft tissues. There is metallic foreign body noted within the soft tissues in the left lateral face inferior to the left external auditory canal. The external auditory canal is not visualized, presumably occluded by the soft tissue swelling which extends into the left ear. Recommend clinical correlation.  IMPRESSION: Soft tissue swelling in the left side of the face and temporal region and in the region of the left ear. Metallic foreign body noted within the soft tissues in the area swelling. This is of unknown etiology. Recommend clinical correlation.  No intracranial abnormality.   Electronically Signed   By: Charlett Nose M.D.   On: 08/06/2014 17:55   Dg Chest Portable 1 View  08/06/2014   CLINICAL DATA:  Endotracheal tube placement post CPR.  EXAM: PORTABLE CHEST - 1 VIEW  COMPARISON:  08/08/2014  FINDINGS: Endotracheal tube is positioned 4 cm from carina.  Normal cardiac silhouette. There is mild pulmonary edema pattern. No fracture pneumothorax.  IMPRESSION: Endotracheal tube in good position.  Mild pulmonary edema pattern   Electronically Signed   By: Genevive Bi M.D.   On: 06-Sep-2014 20:21   Dg Chest Port 1 View  09-06-2014   CLINICAL DATA:  Altered mental status.  Fever.  EXAM: PORTABLE CHEST - 1 VIEW  COMPARISON:  None.  FINDINGS: There is slight haziness at both lung bases but the patient has taken a shallow inspiration. Heart size and pulmonary vascularity are normal. No acute osseous abnormality.  IMPRESSION: Haziness at the bases is most likely due to a shallow inspiration but I  cannot exclude subtle infiltrates.   Electronically Signed   By: Francene Boyers M.D.   On: 09/06/2014 16:21     EKG Interpretation   Date/Time:  Friday Sep 06, 2014 15:18:21 EDT Ventricular Rate:  142 PR Interval:  136 QRS Duration: 78 QT Interval:  295 QTC Calculation: 453 R Axis:   59 Text Interpretation:  Sinus tachycardia Abnormal R-wave progression, early  transition Borderline repolarization abnormality Nonspecific T wave  abnormality Abnormal ekg No old tracing to compare Confirmed by MILLER   MD, BRIAN ([id number]) on 2014/09/06 3:56:40 PM      MDM   Final diagnoses:  Altered mental status  Severe sepsis  Lactic acid acidosis  Hypotension, unspecified hypotension type  Hyperglycemia  Cardiac arrest  Respiratory distress    Patient is a 59 year old male past medical history as noted above who presents today with altered mental status. On arrival patient is notably hyperglycemic and disoriented. He is awake and alert but is consistently hypotensive. Patient is also febrile at 102.1.  Concern is for severe sepsis with signs of end organ damage. Patient notably has a initial troponin 0.64 and lactic acidosis of 11. sepsis order set was initiated patient was given normal saline 30 mL/kg dose. Additionally patient was started on vancomycin and Zosyn for broad-spectrum coverage. Blood cultures and urine cultures and chest x-ray were obtained. Interpretation of these labs and imaging studies reveals no obvious source of patient's infection.  After receiving fluids patient's blood pressure did seem to start to improve. Patient also notably became more alert and was responding more appropriately to questions.  Discussed with the intensive care physician who will admit the patient to their service for further evaluation and management. Shortly after this discussion I was notified by nursing that patient had become unresponsive. On arrival patient was apneic and no pulse could be  palpated. We immediately started CPR and patient was ventilated with bag-valve-mask. Patient was also intubated as noted above. After receiving 2 doses of epinephrine patient did regain pulses. Shortly after that patient lost pulses again and CPR was initiated and patient was ultimately started on epinephrine drip. During this time also placed a femoral central line for central access. ICU team was made aware of change in patient's condition.  Patient was in critical condition at the time of transfer to the intensive care unit for further management. At this point is unclear what the source is of patient's fever.     Madolyn Frieze, MD 08/19/2014 6045  Eber Hong, MD 08/16/2014 [phone]

## 2014-08-22 NOTE — Code Documentation (Addendum)
ATTEMPTING TO DO A CENTRAL LINE BY ED RES

## 2014-08-22 NOTE — ED Notes (Signed)
Anthony Ferrell, wife [phone], Anthony Ferrell, daughter [phone]

## 2014-08-23 LAB — GLUCOSE, CAPILLARY: GLUCOSE-CAPILLARY: 252 mg/dL — AB (ref 65–99)

## 2014-08-25 LAB — PATHOLOGIST SMEAR REVIEW

## 2014-08-25 MED FILL — Medication: Qty: 1 | Status: AC

## 2014-08-25 NOTE — Significant Event (Signed)
64 AAM h/o diabetes who was admitted with DKA.  He was still in the ED when code blue was called.  I arrived and compressions were in progress, the patient was intubated.  The arrest was PEA, ROSC achieved after .  ABG and electrolyte panels demonatrated acidema but no florid electrolyte abnormalities.   While in the ED over the next hour he arrested 4 additional times, each time PEA, each time with ROSC responding to epinephrine, he was placed on an epinephrine gtt and the patient was transferred to the ICU.  In the MICU he arrested 3 more times with progressive mixed acidemia.  Despite aggressive treatments with ventilation, bicarb electrolyte replacement he became worse.  On the 8th arrest there was no identifiable/reversible cause of his acidemia.    Bedside ultrasound demonstrated:  No Pneumothorax No Pleural or cardiac effusions Profound global cardiac hypokinesis EF <10% No free fluid in the abdomen.  His exam demonstrated dilated and fixed pupils.   I spoke with the family twice in between codes they had asked [place] to continue.    After 10 min into the 8th arrest, no pulse was recovered despite maximum medical therapy.  The decision was made to halt the code.  I called his family and updated them.  They arrived at the hospital shortly after and we further discussed the events of the night.  Ultimately they were accepting and appreciative of the MICU/hosptial team effort.  Time of death 2103-05-26  Total critical care time: 60 min  Critical care time was exclusive of separately billable procedures and treating other patients.  Critical care was necessary to treat or prevent imminent or life-threatening deterioration.  Critical care was time spent personally by me on the following activities: development of treatment plan with patient and/or surrogate as well as nursing, discussions with consultants, evaluation of patient's response to treatment, examination of patient, obtaining history  from patient or surrogate, ordering and performing treatments and interventions, ordering and review of laboratory studies, ordering and review of radiographic studies, pulse oximetry and re-evaluation of patient's condition.   Galvin Proffer, DO., MS [facility]

## 2014-08-25 DEATH — deceased

## 2014-08-27 LAB — CULTURE, BLOOD (ROUTINE X 2)

## 2014-08-27 LAB — URINE CULTURE

## 2014-08-28 MED FILL — Medication: Qty: 1 | Status: AC

## 2014-09-10 NOTE — Discharge Summary (Addendum)
NAMESLAYTER, MOORHOUSE NO.:  [id number]  MEDICAL RECORD NO.:  [id number]  LOCATION:  2M03C                        FACILITY:  [facility]  PHYSICIAN:  Nelda Bucks, MD DATE OF BIRTH:  03/24/1955  DATE OF ADMISSION:  2014/08/27 DATE OF DISCHARGE:  08/03/2014                              DISCHARGE SUMMARY   DEATH SUMMARY:  This is a 59 year old male, who initially came into the [facility] Emergency Room with unclear past medical history, except for his diabetes, found to be unresponsive of hyperglycemia with the urinary tract infection, suffering from concerns of occult septic shock, acute renal failure.  CT scan of the head was done, which showed a left temporal swelling, unclear foreign body, essentially negative brain CT.  Working diagnosis in the emergency room was basically DKA with urosepsis with elevated lactic acid level.  The patient was provided with aggressive fluid resuscitation and aggressive antibiotics.  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  From Pulmonary perspective, on admission, the patient was in mild to no distress.  He had __________ fluid resuscitation.  Lactic acid was repeated after 30 mL/kg bolus.  There was consideration for __________. The patient was suffering from acute renal failure.  Boluses were provided, fluid resuscitation __________ frequently provided, concerns of hypovolemia and chronic renal insufficiency underlying.  The patient was provided aggressive antibiotics with Zosyn and vancomycin. Ultrasound of the kidney was ordered to make sure there is no hydronephrosis and a DKA protocol ordered.  Unfortunately in the emergency room, the patient suffered a cardiac arrest on multiple occasions.  Warner Mccreedy was transferred up to the intensive care unit, where Critical Care responded again with __________ cardiac arrest  and ultrasound was done of the chest with concerns of echo as well as the abdomen and no significant findings.  The patient was unable to be resuscitated __________.  FINAL DIAGNOSES UPON DEATH: 1. Occult septic shock due to uti e coli 2. Urinary tract infection. 3. Acute respiratory failure. 4. Status post cardiac arrest. 5. Acute renal failure. 6. Diabetes with diabetic ketoacidosis.     Nelda Bucks, MD     DJF/MEDQ  D:  09/09/2014  T:  09/10/2014  Job:  [id number]

## 2017-02-09 IMAGING — DX DG CHEST 1V PORT
1 series · 1 of 1 positions shown · non-contrast
Comparison: 08/22/2014

CLINICAL DATA: Endotracheal tube placement post CPR.

EXAM:
PORTABLE CHEST - 1 VIEW

[chest ap]
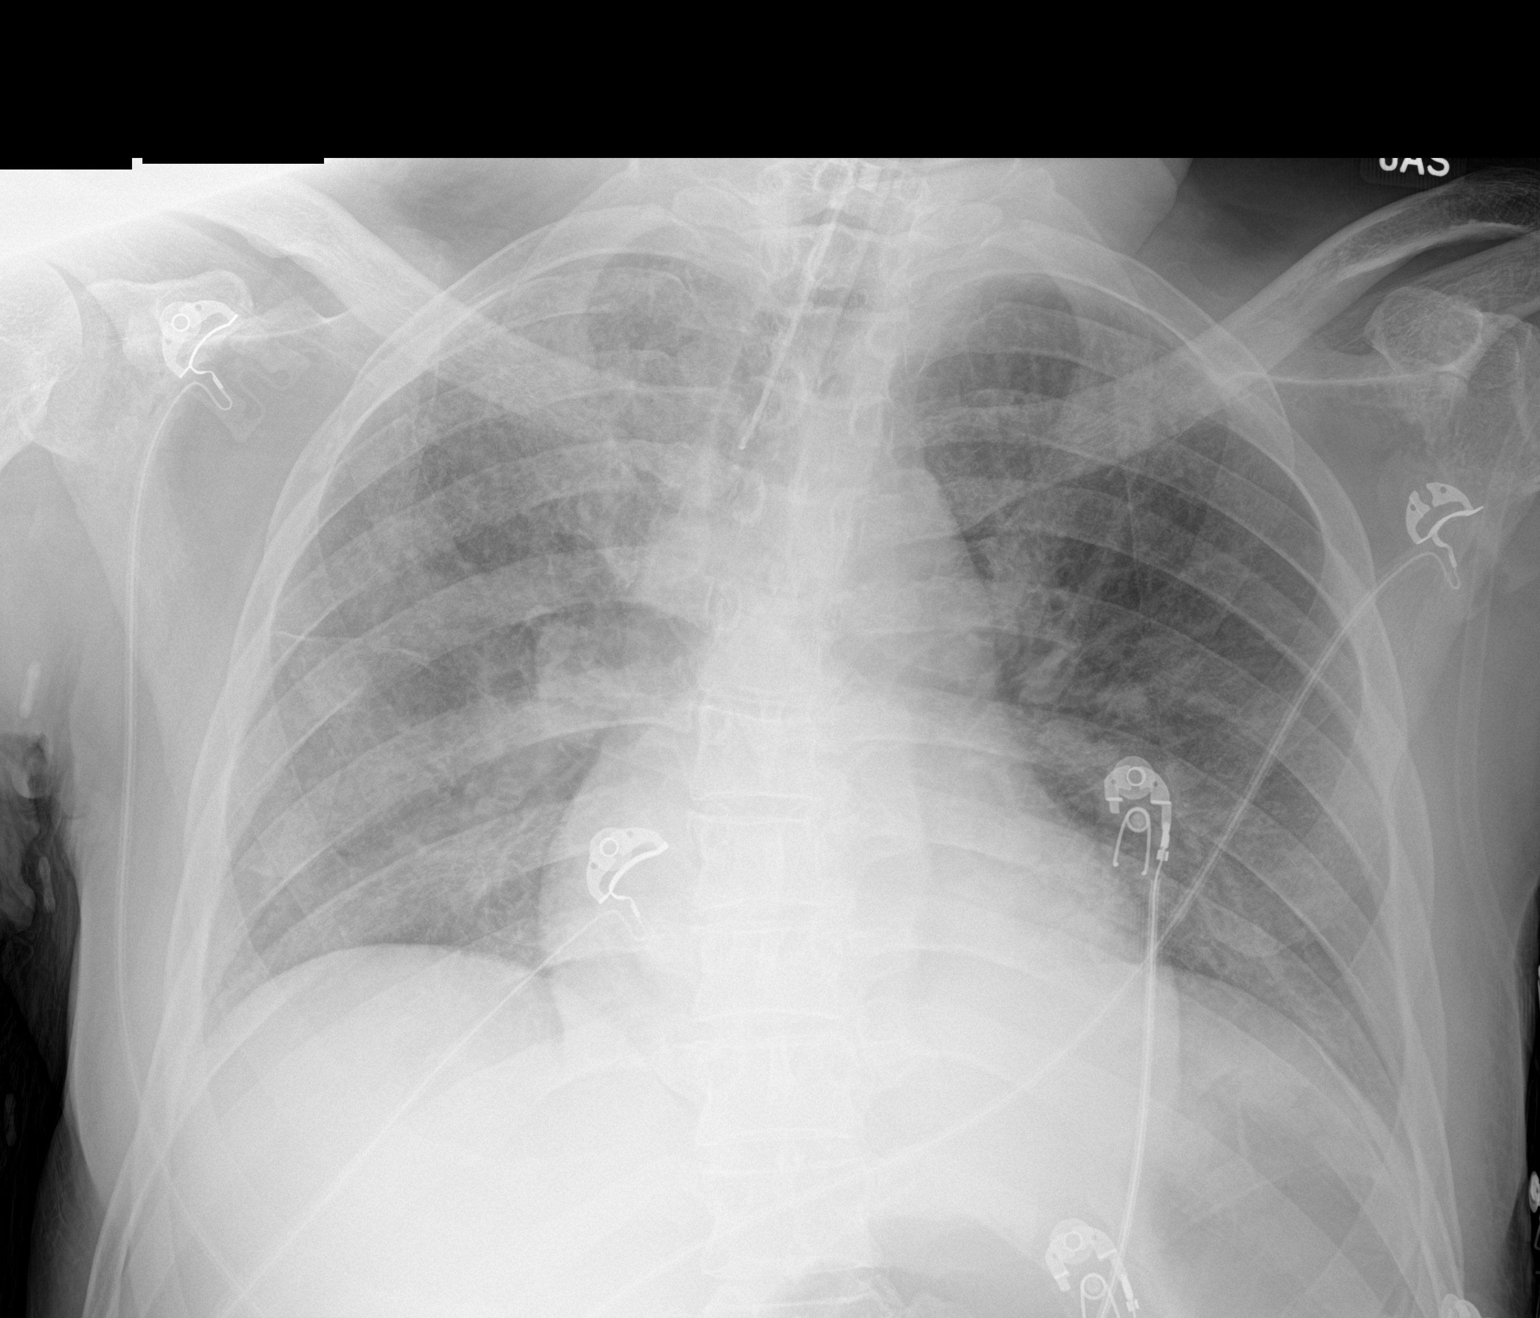

[1 of 1 positions shown; findings below may reference images not displayed]

FINDINGS: Endotracheal tube is positioned 4 cm from carina. Normal cardiac
silhouette. There is mild pulmonary edema pattern. No fracture
pneumothorax.
IMPRESSION: Endotracheal tube in good position.

Mild pulmonary edema pattern
